# Patient Record
Sex: Female | Born: 1945 | Hispanic: Yes | Marital: Married | Smoking: Former smoker
Health system: Southern US, Community
[De-identification: ages and names within clinical notes are randomized; demographics above are authoritative.]

## PROBLEM LIST (undated history)

## (undated) DIAGNOSIS — C801 Malignant (primary) neoplasm, unspecified: Secondary | ICD-10-CM

## (undated) DIAGNOSIS — F32A Depression, unspecified: Secondary | ICD-10-CM

## (undated) DIAGNOSIS — F419 Anxiety disorder, unspecified: Secondary | ICD-10-CM

## (undated) DIAGNOSIS — F329 Major depressive disorder, single episode, unspecified: Secondary | ICD-10-CM

## (undated) HISTORY — PX: CHOLECYSTECTOMY: SHX55

## (undated) HISTORY — PX: ABDOMINAL HYSTERECTOMY: SHX81

## (undated) HISTORY — PX: JOINT REPLACEMENT: SHX530

## (undated) HISTORY — PX: LUNG CANCER SURGERY: SHX702

---

## 2016-06-01 ENCOUNTER — Other Ambulatory Visit: Payer: Self-pay | Admitting: Orthopedic Surgery

## 2016-06-01 DIAGNOSIS — M67912 Unspecified disorder of synovium and tendon, left shoulder: Secondary | ICD-10-CM

## 2016-06-08 ENCOUNTER — Other Ambulatory Visit: Payer: Self-pay

## 2016-06-13 ENCOUNTER — Other Ambulatory Visit: Payer: Self-pay

## 2016-06-18 ENCOUNTER — Ambulatory Visit
Admission: RE | Admit: 2016-06-18 | Discharge: 2016-06-18 | Disposition: A | Discharge status: Home / Self Care | Payer: Medicare Other | Source: Ambulatory Visit | Attending: Orthopedic Surgery | Admitting: Orthopedic Surgery

## 2016-06-18 DIAGNOSIS — M67912 Unspecified disorder of synovium and tendon, left shoulder: Secondary | ICD-10-CM

## 2016-06-20 ENCOUNTER — Other Ambulatory Visit: Payer: Self-pay

## 2016-06-25 ENCOUNTER — Other Ambulatory Visit: Payer: Self-pay | Admitting: Orthopedic Surgery

## 2016-06-27 ENCOUNTER — Encounter (HOSPITAL_BASED_OUTPATIENT_CLINIC_OR_DEPARTMENT_OTHER): Payer: Self-pay | Admitting: *Deleted

## 2016-06-28 ENCOUNTER — Encounter (HOSPITAL_BASED_OUTPATIENT_CLINIC_OR_DEPARTMENT_OTHER)
Admission: RE | Admit: 2016-06-28 | Discharge: 2016-06-28 | Disposition: A | Discharge status: Home / Self Care | Payer: Medicare Other | Source: Ambulatory Visit | Attending: Orthopedic Surgery | Admitting: Orthopedic Surgery

## 2016-06-28 ENCOUNTER — Other Ambulatory Visit: Payer: Self-pay

## 2016-06-28 DIAGNOSIS — I10 Essential (primary) hypertension: Secondary | ICD-10-CM | POA: Diagnosis not present

## 2016-06-28 DIAGNOSIS — E119 Type 2 diabetes mellitus without complications: Secondary | ICD-10-CM | POA: Diagnosis present

## 2016-06-28 LAB — BASIC METABOLIC PANEL
ANION GAP: 12 (ref 5–15)
BUN: 16 mg/dL (ref 6–20)
CALCIUM: 9.2 mg/dL (ref 8.9–10.3)
CO2: 25 mmol/L (ref 22–32)
CREATININE: 0.66 mg/dL (ref 0.44–1.00)
Chloride: 105 mmol/L (ref 101–111)
GFR calc Af Amer: >60 mL/min (ref >60)
GLUCOSE: 89 mg/dL (ref 65–99)
Potassium: 4.8 mmol/L (ref 3.5–5.1)
Sodium: 142 mmol/L (ref 135–145)

## 2016-07-02 ENCOUNTER — Encounter (HOSPITAL_BASED_OUTPATIENT_CLINIC_OR_DEPARTMENT_OTHER)
Admission: RE | Disposition: A | Discharge status: Home / Self Care | Payer: Self-pay | Source: Ambulatory Visit | Attending: Orthopedic Surgery

## 2016-07-02 ENCOUNTER — Ambulatory Visit (HOSPITAL_BASED_OUTPATIENT_CLINIC_OR_DEPARTMENT_OTHER): Payer: Medicare Other | Admitting: Certified Registered"

## 2016-07-02 ENCOUNTER — Ambulatory Visit (HOSPITAL_BASED_OUTPATIENT_CLINIC_OR_DEPARTMENT_OTHER)
Admission: RE | Admit: 2016-07-02 | Discharge: 2016-07-02 | Disposition: A | Discharge status: Home / Self Care | Payer: Medicare Other | Source: Ambulatory Visit | Attending: Orthopedic Surgery | Admitting: Orthopedic Surgery

## 2016-07-02 ENCOUNTER — Encounter (HOSPITAL_BASED_OUTPATIENT_CLINIC_OR_DEPARTMENT_OTHER): Payer: Self-pay | Admitting: Anesthesiology

## 2016-07-02 DIAGNOSIS — Z85118 Personal history of other malignant neoplasm of bronchus and lung: Secondary | ICD-10-CM | POA: Insufficient documentation

## 2016-07-02 DIAGNOSIS — F329 Major depressive disorder, single episode, unspecified: Secondary | ICD-10-CM | POA: Insufficient documentation

## 2016-07-02 DIAGNOSIS — Z87891 Personal history of nicotine dependence: Secondary | ICD-10-CM | POA: Diagnosis not present

## 2016-07-02 DIAGNOSIS — Z7984 Long term (current) use of oral hypoglycemic drugs: Secondary | ICD-10-CM | POA: Diagnosis not present

## 2016-07-02 DIAGNOSIS — Z7982 Long term (current) use of aspirin: Secondary | ICD-10-CM | POA: Diagnosis not present

## 2016-07-02 DIAGNOSIS — M94212 Chondromalacia, left shoulder: Secondary | ICD-10-CM | POA: Diagnosis not present

## 2016-07-02 DIAGNOSIS — M75112 Incomplete rotator cuff tear or rupture of left shoulder, not specified as traumatic: Secondary | ICD-10-CM | POA: Diagnosis not present

## 2016-07-02 DIAGNOSIS — M12812 Other specific arthropathies, not elsewhere classified, left shoulder: Secondary | ICD-10-CM | POA: Insufficient documentation

## 2016-07-02 DIAGNOSIS — F419 Anxiety disorder, unspecified: Secondary | ICD-10-CM | POA: Diagnosis not present

## 2016-07-02 DIAGNOSIS — M25512 Pain in left shoulder: Secondary | ICD-10-CM | POA: Diagnosis present

## 2016-07-02 HISTORY — PX: SHOULDER ARTHROSCOPY WITH SUBACROMIAL DECOMPRESSION: SHX5684

## 2016-07-02 HISTORY — DX: Major depressive disorder, single episode, unspecified: F32.9

## 2016-07-02 HISTORY — DX: Anxiety disorder, unspecified: F41.9

## 2016-07-02 HISTORY — DX: Depression, unspecified: F32.A

## 2016-07-02 HISTORY — DX: Malignant (primary) neoplasm, unspecified: C80.1

## 2016-07-02 LAB — GLUCOSE, CAPILLARY
Glucose-Capillary: 122 mg/dL — ABNORMAL HIGH (ref 65–99)
Glucose-Capillary: 111 mg/dL — ABNORMAL HIGH (ref 65–99)

## 2016-07-02 SURGERY — SHOULDER ARTHROSCOPY WITH SUBACROMIAL DECOMPRESSION
Anesthesia: General | Laterality: Left

## 2016-07-02 MED ORDER — MEPERIDINE HCL 25 MG/ML IJ SOLN: 6.2500 mg | INTRAMUSCULAR | Status: DC | PRN

## 2016-07-02 MED ORDER — FENTANYL CITRATE (PF) 100 MCG/2ML IJ SOLN
50.0000 ug | INTRAMUSCULAR | Status: AC | PRN
  Administered 2016-07-02: 50 ug via INTRAVENOUS
  Administered 2016-07-02 (×2): 25 ug via INTRAVENOUS

## 2016-07-02 MED ORDER — MIDAZOLAM HCL 2 MG/2ML IJ SOLN
INTRAMUSCULAR | Status: AC
  Filled 2016-07-02: qty 2

## 2016-07-02 MED ORDER — PHENYLEPHRINE HCL 10 MG/ML IJ SOLN
INTRAMUSCULAR | Status: DC | PRN
  Administered 2016-07-02 (×6): 80 ug via INTRAVENOUS

## 2016-07-02 MED ORDER — LACTATED RINGERS IV SOLN: INTRAVENOUS | Status: DC

## 2016-07-02 MED ORDER — BUPIVACAINE-EPINEPHRINE (PF) 0.5% -1:200000 IJ SOLN
INTRAMUSCULAR | Status: DC | PRN
  Administered 2016-07-02: 15 mL via PERINEURAL

## 2016-07-02 MED ORDER — CEFAZOLIN SODIUM-DEXTROSE 2-4 GM/100ML-% IV SOLN
2.0000 g | INTRAVENOUS | Status: AC
  Administered 2016-07-02: 2 g via INTRAVENOUS

## 2016-07-02 MED ORDER — OXYCODONE-ACETAMINOPHEN 5-325 MG PO TABS: 1.0000 | ORAL_TABLET | ORAL | 0 refills | Status: AC | PRN

## 2016-07-02 MED ORDER — DEXAMETHASONE SODIUM PHOSPHATE 4 MG/ML IJ SOLN
INTRAMUSCULAR | Status: DC | PRN
  Administered 2016-07-02: 4 mg via INTRAVENOUS

## 2016-07-02 MED ORDER — ONDANSETRON HCL 4 MG/2ML IJ SOLN
INTRAMUSCULAR | Status: DC | PRN
Start: 2016-07-02 — End: 2016-07-02
  Administered 2016-07-02: 4 mg via INTRAVENOUS

## 2016-07-02 MED ORDER — CEFAZOLIN SODIUM-DEXTROSE 2-4 GM/100ML-% IV SOLN
INTRAVENOUS | Status: AC
  Filled 2016-07-02: qty 100

## 2016-07-02 MED ORDER — SUCCINYLCHOLINE CHLORIDE 20 MG/ML IJ SOLN
INTRAMUSCULAR | Status: DC | PRN
  Administered 2016-07-02: 120 mg via INTRAVENOUS

## 2016-07-02 MED ORDER — LACTATED RINGERS IV SOLN
INTRAVENOUS | Status: DC
  Administered 2016-07-02 (×2): via INTRAVENOUS

## 2016-07-02 MED ORDER — LIDOCAINE HCL (CARDIAC) 20 MG/ML IV SOLN
INTRAVENOUS | Status: DC | PRN
  Administered 2016-07-02: 60 mg via INTRAVENOUS

## 2016-07-02 MED ORDER — DOCUSATE SODIUM 100 MG PO CAPS: 100.0000 mg | ORAL_CAPSULE | Freq: Three times a day (TID) | ORAL | 0 refills | Status: AC | PRN

## 2016-07-02 MED ORDER — GLYCOPYRROLATE 0.2 MG/ML IJ SOLN
INTRAMUSCULAR | Status: DC | PRN
  Administered 2016-07-02: 0.2 mg via INTRAVENOUS

## 2016-07-02 MED ORDER — SCOPOLAMINE 1 MG/3DAYS TD PT72: 1.0000 | MEDICATED_PATCH | Freq: Once | TRANSDERMAL | Status: DC | PRN

## 2016-07-02 MED ORDER — FENTANYL CITRATE (PF) 100 MCG/2ML IJ SOLN: 25.0000 ug | INTRAMUSCULAR | Status: DC | PRN

## 2016-07-02 MED ORDER — MIDAZOLAM HCL 2 MG/2ML IJ SOLN
1.0000 mg | INTRAMUSCULAR | Status: DC | PRN
  Administered 2016-07-02: 2 mg via INTRAVENOUS

## 2016-07-02 MED ORDER — POVIDONE-IODINE 7.5 % EX SOLN: Freq: Once | CUTANEOUS | Status: DC

## 2016-07-02 MED ORDER — PROMETHAZINE HCL 25 MG/ML IJ SOLN: 6.2500 mg | INTRAMUSCULAR | Status: DC | PRN

## 2016-07-02 MED ORDER — PROPOFOL 10 MG/ML IV BOLUS
INTRAVENOUS | Status: DC | PRN
  Administered 2016-07-02: 20 mg via INTRAVENOUS
  Administered 2016-07-02: 150 mg via INTRAVENOUS
  Administered 2016-07-02: 50 mg via INTRAVENOUS

## 2016-07-02 MED ORDER — FENTANYL CITRATE (PF) 100 MCG/2ML IJ SOLN
INTRAMUSCULAR | Status: AC
  Filled 2016-07-02: qty 2

## 2016-07-02 SURGICAL SUPPLY — 82 items
BENZOIN TINCTURE PRP APPL 2/3 (GAUZE/BANDAGES/DRESSINGS) IMPLANT
BLADE CLIPPER SURG (BLADE) IMPLANT
BLADE SURG 15 STRL LF DISP TIS (BLADE) IMPLANT
BLADE SURG 15 STRL SS (BLADE)
BUR OVAL 4.0 (BURR) ×3 IMPLANT
CANNULA 5.75X71 LONG (CANNULA) ×3 IMPLANT
CANNULA TWIST IN 8.25X7CM (CANNULA) IMPLANT
CHLORAPREP W/TINT 26ML (MISCELLANEOUS) ×3 IMPLANT
CLOSURE WOUND 1/2 X4 (GAUZE/BANDAGES/DRESSINGS)
DECANTER SPIKE VIAL GLASS SM (MISCELLANEOUS) IMPLANT
DERMABOND ADVANCED (GAUZE/BANDAGES/DRESSINGS)
DERMABOND ADVANCED .7 DNX12 (GAUZE/BANDAGES/DRESSINGS) IMPLANT
DRAPE IMP U-DRAPE 54X76 (DRAPES) ×3 IMPLANT
DRAPE INCISE IOBAN 66X45 STRL (DRAPES) ×3 IMPLANT
DRAPE STERI 35X30 U-POUCH (DRAPES) ×3 IMPLANT
DRAPE SURG 17X23 STRL (DRAPES) ×3 IMPLANT
DRAPE U-SHAPE 47X51 STRL (DRAPES) ×3 IMPLANT
DRAPE U-SHAPE 76X120 STRL (DRAPES) ×6 IMPLANT
DRSG PAD ABDOMINAL 8X10 ST (GAUZE/BANDAGES/DRESSINGS) ×3 IMPLANT
ELECT REM PT RETURN 9FT ADLT (ELECTROSURGICAL)
ELECTRODE REM PT RTRN 9FT ADLT (ELECTROSURGICAL) IMPLANT
GAUZE SPONGE 4X4 12PLY STRL (GAUZE/BANDAGES/DRESSINGS) ×3 IMPLANT
GAUZE SPONGE 4X4 16PLY XRAY LF (GAUZE/BANDAGES/DRESSINGS) IMPLANT
GAUZE XEROFORM 1X8 LF (GAUZE/BANDAGES/DRESSINGS) ×3 IMPLANT
GLOVE BIO SURGEON STRL SZ7 (GLOVE) ×3 IMPLANT
GLOVE BIO SURGEON STRL SZ7.5 (GLOVE) ×3 IMPLANT
GLOVE BIOGEL PI IND STRL 7.0 (GLOVE) ×1 IMPLANT
GLOVE BIOGEL PI IND STRL 8 (GLOVE) ×1 IMPLANT
GLOVE BIOGEL PI INDICATOR 7.0 (GLOVE) ×2
GLOVE BIOGEL PI INDICATOR 8 (GLOVE) ×2
GOWN STRL REUS W/ TWL LRG LVL3 (GOWN DISPOSABLE) ×2 IMPLANT
GOWN STRL REUS W/ TWL XL LVL3 (GOWN DISPOSABLE) ×1 IMPLANT
GOWN STRL REUS W/TWL LRG LVL3 (GOWN DISPOSABLE) ×4
GOWN STRL REUS W/TWL XL LVL3 (GOWN DISPOSABLE) ×2
LASSO CRESCENT QUICKPASS (SUTURE) IMPLANT
MANIFOLD NEPTUNE II (INSTRUMENTS) ×3 IMPLANT
NDL SUT 6 .5 CRC .975X.05 MAYO (NEEDLE) IMPLANT
NEEDLE 1/2 CIR CATGUT .05X1.09 (NEEDLE) IMPLANT
NEEDLE MAYO TAPER (NEEDLE)
NEEDLE SCORPION MULTI FIRE (NEEDLE) IMPLANT
NS IRRIG 1000ML POUR BTL (IV SOLUTION) IMPLANT
PACK ARTHROSCOPY DSU (CUSTOM PROCEDURE TRAY) ×3 IMPLANT
PACK BASIN DAY SURGERY FS (CUSTOM PROCEDURE TRAY) ×3 IMPLANT
PENCIL BUTTON HOLSTER BLD 10FT (ELECTRODE) ×3 IMPLANT
PROBE BIPOLAR ATHRO 135MM 90D (MISCELLANEOUS) ×3 IMPLANT
RESECTOR FULL RADIUS 4.2MM (BLADE) ×3 IMPLANT
RESTRAINT HEAD UNIVERSAL NS (MISCELLANEOUS) ×3 IMPLANT
SHEET MEDIUM DRAPE 40X70 STRL (DRAPES) IMPLANT
SLEEVE SCD COMPRESS KNEE MED (MISCELLANEOUS) ×3 IMPLANT
SLING ARM FOAM STRAP LRG (SOFTGOODS) IMPLANT
SLING ARM IMMOBILIZER LRG (SOFTGOODS) ×3 IMPLANT
SLING ARM IMMOBILIZER MED (SOFTGOODS) IMPLANT
SLING ARM MED ADULT FOAM STRAP (SOFTGOODS) ×3 IMPLANT
SLING ARM XL FOAM STRAP (SOFTGOODS) IMPLANT
SPONGE LAP 4X18 X RAY DECT (DISPOSABLE) ×3 IMPLANT
STRIP CLOSURE SKIN 1/2X4 (GAUZE/BANDAGES/DRESSINGS) IMPLANT
SUCTION FRAZIER HANDLE 10FR (MISCELLANEOUS)
SUCTION TUBE FRAZIER 10FR DISP (MISCELLANEOUS) IMPLANT
SUPPORT WRAP ARM LG (MISCELLANEOUS) IMPLANT
SUT BONE WAX W31G (SUTURE) IMPLANT
SUT ETHILON 3 0 PS 1 (SUTURE) ×3 IMPLANT
SUT ETHILON 4 0 PS 2 18 (SUTURE) IMPLANT
SUT FIBERWIRE #2 38 T-5 BLUE (SUTURE)
SUT MNCRL AB 4-0 PS2 18 (SUTURE) IMPLANT
SUT PDS AB 0 CT 36 (SUTURE) IMPLANT
SUT PROLENE 3 0 PS 2 (SUTURE) IMPLANT
SUT TIGER TAPE 7 IN WHITE (SUTURE) IMPLANT
SUT VIC AB 0 CT1 27 (SUTURE) ×2
SUT VIC AB 0 CT1 27XBRD ANBCTR (SUTURE) ×1 IMPLANT
SUT VIC AB 2-0 SH 27 (SUTURE) ×2
SUT VIC AB 2-0 SH 27XBRD (SUTURE) ×1 IMPLANT
SUTURE FIBERWR #2 38 T-5 BLUE (SUTURE) IMPLANT
SYR BULB 3OZ (MISCELLANEOUS) IMPLANT
TAPE CLOTH SURG 6X10 WHT LF (GAUZE/BANDAGES/DRESSINGS) ×3 IMPLANT
TAPE FIBER 2MM 7IN #2 BLUE (SUTURE) IMPLANT
TOWEL OR 17X24 6PK STRL BLUE (TOWEL DISPOSABLE) ×3 IMPLANT
TOWEL OR NON WOVEN STRL DISP B (DISPOSABLE) ×3 IMPLANT
TUBE CONNECTING 20'X1/4 (TUBING) ×1
TUBE CONNECTING 20X1/4 (TUBING) ×2 IMPLANT
TUBING ARTHROSCOPY IRRIG 16FT (MISCELLANEOUS) ×3 IMPLANT
WATER STERILE IRR 1000ML POUR (IV SOLUTION) ×3 IMPLANT
YANKAUER SUCT BULB TIP NO VENT (SUCTIONS) IMPLANT

## 2016-07-02 NOTE — Anesthesia Preprocedure Evaluation (Addendum)
Anesthesia Evaluation  Patient identified by MRN, date of birth, ID band Patient awake    Reviewed: Allergy & Precautions, NPO status , Patient's Chart, lab work & pertinent test results  Airway Mallampati: II  TM Distance: >3 FB Neck ROM: Full    Dental  (+) Teeth Intact, Dental Advisory Given   Pulmonary former smoker,    breath sounds clear to auscultation       Cardiovascular hypertension, Pt. on medications  Rhythm:Regular Rate:Normal     Neuro/Psych PSYCHIATRIC DISORDERS Anxiety Depression negative neurological ROS     GI/Hepatic negative GI ROS, Neg liver ROS,   Endo/Other  diabetes, Type 2, Oral Hypoglycemic Agents  Renal/GU negative Renal ROS  negative genitourinary   Musculoskeletal negative musculoskeletal ROS (+)   Abdominal   Peds negative pediatric ROS (+)  Hematology negative hematology ROS (+)   Anesthesia Other Findings   Reproductive/Obstetrics negative OB ROS                            No results found for: WBC, HGB, HCT, MCV, PLT   06/2016 EKG: normal sinus rhythm.  Anesthesia Physical Anesthesia Plan  ASA: II  Anesthesia Plan: General   Post-op Pain Management: GA combined w/ Regional for post-op pain   Induction: Intravenous  Airway Management Planned: Oral ETT  Additional Equipment:   Intra-op Plan:   Post-operative Plan: Extubation in OR  Informed Consent: I have reviewed the patients History and Physical, chart, labs and discussed the procedure including the risks, benefits and alternatives for the proposed anesthesia with the patient or authorized representative who has indicated his/her understanding and acceptance.   Dental advisory given  Plan Discussed with: CRNA  Anesthesia Plan Comments:         Anesthesia Quick Evaluation

## 2016-07-02 NOTE — Transfer of Care (Signed)
Immediate Anesthesia Transfer of Care Note  Patient: Alyssa Dillon  Procedure(s) Performed: Procedure(s) with comments: LEFT SHOULDER ARTHROSCOPY DEBRIDEMENT ROTATOR CUFF, SUBACROMIAL DECOMPRESSION, DISTAL CLAVICAL EXCISION REMOVAL OF LOSE BODY (Left) - LEFT SHOULDER ARTHROSCOPY DEBRIDEMENT ROTATOR CUFF, SUBACROMIAL DECOMPRESSION, DISTAL CLAVICAL EXCISION  Patient Location: PACU  Anesthesia Type:GA combined with regional for post-op pain  Level of Consciousness: awake, alert  and oriented  Airway & Oxygen Therapy: Patient Spontanous Breathing and Patient connected to face mask oxygen  Post-op Assessment: Report given to RN and Post -op Vital signs reviewed and stable  Post vital signs: Reviewed and stable  Last Vitals:  Vitals:   07/02/16 1215 07/02/16 1415  BP: 108/61 (!) 149/86  Pulse: 69 90  Resp: 19   Temp:      Last Pain:  Vitals:   07/02/16 1134  TempSrc: Oral         Complications: No apparent anesthesia complications

## 2016-07-02 NOTE — H&P (Signed)
Alyssa Dillon is an 71 y.o. female.   Chief Complaint: L shoulder pain and dsyfunction HPI: L shoulder pain with partial RCT, AC arthropathy, failed nonoperative treatment.  Past Medical History:  Diagnosis Date  . Anxiety   . Cancer ([place])   . Depression     Past Surgical History:  Procedure Laterality Date  . ABDOMINAL HYSTERECTOMY     1994  . CHOLECYSTECTOMY     1985  . JOINT REPLACEMENT     October 2017  . LUNG CANCER SURGERY Right    2014    History reviewed. No pertinent family history. Social History:  reports that she has quit smoking. She has never used smokeless tobacco. She reports that she drinks alcohol. She reports that she does not use drugs.  Allergies: No Known Allergies  Medications Prior to Admission  Medication Sig Dispense Refill  . amlodipine-atorvastatin (CADUET) 2.5-10 MG tablet Take 1 tablet by mouth daily.    Marland Kitchen aspirin EC 81 MG tablet Take 81 mg by mouth daily.    Marland Kitchen glipiZIDE (GLUCOTROL) 5 MG tablet Take 5 mg by mouth daily after supper.    . ibandronate (BONIVA) 150 MG tablet Take 150 mg by mouth every 30 (thirty) days. Take in the morning with a full glass of water, on an empty stomach, and do not take anything else by mouth or lie down for the next 30 min.    . irbesartan (AVAPRO) 150 MG tablet Take 150 mg by mouth every morning.    . metFORMIN (GLUCOPHAGE) 500 MG tablet Take by mouth daily with breakfast.    . rosuvastatin (CRESTOR) 5 MG tablet Take 5 mg by mouth daily at 6 PM.    . sertraline (ZOLOFT) 50 MG tablet Take 50 mg by mouth daily.    . vitamin B-12 (CYANOCOBALAMIN) 100 MCG tablet Take 100 mcg by mouth daily.      Results for orders placed or performed during the hospital encounter of 07/02/16 (from the past 48 hour(s))  Glucose, capillary     Status: Abnormal   Collection Time: 07/02/16 11:58 AM  Result Value Ref Range   Glucose-Capillary 122 (H) 65 - 99 mg/dL   No results found.  Review of Systems  All other systems reviewed and  are negative.   Blood pressure 108/61, pulse 69, temperature 97.6 F (36.4 C), temperature source Oral, resp. rate 19, height 5\' 4"  (1.626 m), weight 99.8 kg (220 lb), SpO2 99 %. Physical Exam  Constitutional: She is oriented to person, place, and time. She appears well-developed and well-nourished.  HENT:  Head: Atraumatic.  Eyes: EOM are normal.  Cardiovascular: Intact distal pulses.   Respiratory: Effort normal.  Musculoskeletal:  L shoulder TTP over RC and AC joint.  Neurological: She is alert and oriented to person, place, and time.  Skin: Skin is warm and dry.  Psychiatric: She has a normal mood and affect.     Assessment/Plan  L shoulder pain with partial RCT, AC arthropathy, failed nonoperative treatment. Plan L shoulder arth SAD, DCR Risks / benefits of surgery discussed Consent on chart  NPO for OR Preop antibiotics   Nita Sells, MD 07/02/2016, 12:24 PM

## 2016-07-02 NOTE — Anesthesia Procedure Notes (Signed)
Anesthesia Regional Block:  Interscalene brachial plexus block  Pre-Anesthetic Checklist: ,, timeout performed, Correct Patient, Correct Site, Correct Laterality, Correct Procedure, Correct Position, site marked, Risks and benefits discussed,  Surgical consent,  Pre-op evaluation,  At surgeon's request and post-op pain management  Laterality: Left  Prep: chloraprep       Needles:  Injection technique: Single-shot  Needle Type: Echogenic Needle     Needle Length: 9cm 9 cm Needle Gauge: 21 and 21 G    Additional Needles:  Procedures: ultrasound guided (picture in chart) Interscalene brachial plexus block Narrative:  Start time: 07/02/2016 12:10 PM End time: 07/02/2016 12:15 PM Injection made incrementally with aspirations every 5 mL.  Performed by: Personally  Anesthesiologist: Suella Broad D  Additional Notes: Pt tolerated well.

## 2016-07-02 NOTE — Anesthesia Postprocedure Evaluation (Addendum)
Anesthesia Post Note  Patient: Mariam Dollar  Procedure(s) Performed: Procedure(s) (LRB): LEFT SHOULDER ARTHROSCOPY DEBRIDEMENT ROTATOR CUFF, SUBACROMIAL DECOMPRESSION, DISTAL CLAVICAL EXCISION REMOVAL OF LOSE BODY (Left)  Patient location during evaluation: PACU Anesthesia Type: General Level of consciousness: awake and alert Pain management: pain level controlled Vital Signs Assessment: post-procedure vital signs reviewed and stable Respiratory status: spontaneous breathing, nonlabored ventilation, respiratory function stable and patient connected to nasal cannula oxygen Cardiovascular status: blood pressure returned to baseline and stable Postop Assessment: no signs of nausea or vomiting Anesthetic complications: no       Last Vitals:  Vitals:   07/02/16 1445 07/02/16 1513  BP: 133/89 (!) 158/79  Pulse: 79 78  Resp: 18 18  Temp:  36.5 C    Last Pain:  Vitals:   07/02/16 1513  TempSrc:   PainSc: 0-No pain                 Effie Berkshire

## 2016-07-02 NOTE — Discharge Instructions (Signed)
°  Post Anesthesia Home Care Instructions  Activity: Get plenty of rest for the remainder of the day. A responsible adult should stay with you for 24 hours following the procedure.  For the next 24 hours, DO NOT: -Drive a car -Paediatric nurse -Drink alcoholic beverages -Take any medication unless instructed by your physician -Make any legal decisions or sign important papers.  Meals: Start with liquid foods such as gelatin or soup. Progress to regular foods as tolerated. Avoid greasy, spicy, heavy foods. If nausea and/or vomiting occur, drink only clear liquids until the nausea and/or vomiting subsides. Call your physician if vomiting continues.  Special Instructions/Symptoms: Your throat may feel dry or sore from the anesthesia or the breathing tube placed in your throat during surgery. If this causes discomfort, gargle with warm salt water. The discomfort should disappear within 24 hours.  If you had a scopolamine patch placed behind your ear for the management of post- operative nausea and/or vomiting:  1. The medication in the patch is effective for 72 hours, after which it should be removed.  Wrap patch in a tissue and discard in the trash. Wash hands thoroughly with soap and water. 2. You may remove the patch earlier than 72 hours if you experience unpleasant side effects which may include dry mouth, dizziness or visual disturbances. 3. Avoid touching the patch. Wash your hands with soap and water after contact with the patch.   Regional Anesthesia Blocks  1. Numbness or the inability to move the "blocked" extremity may last from 3-48 hours after placement. The length of time depends on the medication injected and your individual response to the medication. If the numbness is not going away after 48 hours, call your surgeon.  2. The extremity that is blocked will need to be protected until the numbness is gone and the  Strength has returned. Because you cannot feel it, you will need  to take extra care to avoid injury. Because it may be weak, you may have difficulty moving it or using it. You may not know what position it is in without looking at it while the block is in effect.  3. For blocks in the legs and feet, returning to weight bearing and walking needs to be done carefully. You will need to wait until the numbness is entirely gone and the strength has returned. You should be able to move your leg and foot normally before you try and bear weight or walk. You will need someone to be with you when you first try to ensure you do not fall and possibly risk injury.  4. Bruising and tenderness at the needle site are common side effects and will resolve in a few days.  5. Persistent numbness or new problems with movement should be communicated to the surgeon or the [facility] [phone] [place] [phone]).Call your surgeon if you experience:   1.  Fever over 101.0. 2.  Inability to urinate. 3.  Nausea and/or vomiting. 4.  Extreme swelling or bruising at the surgical site. 5.  Continued bleeding from the incision. 6.  Increased pain, redness or drainage from the incision. 7.  Problems related to your pain medication. 8.  Any problems and/or concerns

## 2016-07-02 NOTE — Progress Notes (Signed)
Assisted Dr. Hollis with left, ultrasound guided, supraclavicular block. Side rails up, monitors on throughout procedure. See vital signs in flow sheet. Tolerated Procedure well. 

## 2016-07-02 NOTE — Op Note (Signed)
Procedure(s):   Alyssa Dillon female 71 y.o. 07/02/2016  Procedure(s) and Anesthesia Type: #1 left shoulder arthroscopic debridement chondromalacia humeral head and glenoid #2 left shoulder arthroscopic removal of loose body #3 left shoulder arthroscopic subacromial decompression #4 left shoulder arthroscopic distal clavicle excision   Surgeon(s) and Role:    * Tania Ade, MD - Primary     Surgeon: Nita Sells   Assistants: Jeanmarie Hubert PA-C (Danielle was present and scrubbed throughout the procedure and was essential in positioning, assisting with the camera and instrumentation,, and closure)  Anesthesia: General endotracheal anesthesia with preoperative interscalene block given by the attending anesthesiologist    Procedure Detail    Estimated Blood Loss: Min         Drains: none  Blood Given: none         Specimens: none        Complications:  * No complications entered in OR log *         Disposition: PACU - hemodynamically stable.         Condition: stable    Procedure:   INDICATIONS FOR SURGERY: The patient is 71 y.o. female who has a long history of left shoulder pain which had been refractory and nonoperative management. Indicated for surgical treatment to try and decrease pain and restore function.  OPERATIVE FINDINGS: Examination under anesthesia: No stiffness or instability   DESCRIPTION OF PROCEDURE: The patient was identified in preoperative  holding area where I personally marked the operative site after  verifying site, side, and procedure with the patient. An interscalene block was given by the attending anesthesiologist the holding area.  The patient was taken back to the operating room where general anesthesia was induced without complication and was placed in the beach-chair position with the back  elevated about 60 degrees and all extremities and head and neck carefully padded and  positioned.   The left upper  extremity was then prepped and  draped in a standard sterile fashion. The appropriate time-out  procedure was carried out. The patient did receive IV antibiotics  within 30 minutes of incision.   A small posterior portal incision was made and the arthroscope was introduced into the joint. An anterior portal was then established above the subscapularis using needle localization. Small cannula was placed anteriorly. Diagnostic arthroscopy was then carried out.  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 There frayed indicating impingement is a small area on the glenoid centrally with grade 4 exposed bone. The joint was extensively debrided. There was a fairly large approximately 10 x 6 mm osteochondral loose body in the posterior aspect of the joint which was loosely attached to the posterior labrum. This was removed with a large grasper through the anterior portal. There was a moderate amount of synovitis in the joint. This was debrided with the shaver and ArthriCare. The subscapularis and supraspinatus as well as infraspinatus were intact without significant partial tearing. The labrum superiorly at the base of the biceps was also degenerated and torn and debrided. The biceps origin was not significantly involved.  The arthroscope was then introduced into the subacromial space a standard lateral portal was established with needle localization. The shaver was used through the lateral portal to perform extensive bursectomy. Coracoacromial ligament was examined and found to be frayed indicating impingement.  The bursal surface of the rotator cuff was  carefully examined and noted to be completely intact.  The coracoacromial ligament was taken down off the anterior acromion with the ArthroCare exposing a moderate hooked anterior acromial spur. A  high-speed bur was then used through the lateral portal to take down the anterior acromial spur from lateral to medial in a standard acromioplasty.  The acromioplasty was also viewed from the lateral portal and the bur was used as necessary to ensure that the acromion was completely flat from posterior to anterior.  The distal clavicle was exposed arthroscopically and the bur was used to take off the undersurface for approximately 8 mm from the lateral portal. The bur was then moved to an anterior portal position to complete the distal clavicle excision resecting about 8 mm of the distal clavicle and a smooth even fashion. This was viewed from anterior and lateral portals and felt to be complete.  The arthroscopic equipment was removed from the joint and the portals were closed with 3-0 nylon in an interrupted fashion. Sterile dressings were then applied including Xeroform 4 x 4's ABDs and tape. The patient was then allowed to awaken from general anesthesia, placed in a sling, transferred to the stretcher and taken to the recovery room in stable condition.   POSTOPERATIVE PLAN: The patient will be discharged home today and will followup in one week for suture removal and wound check.  She can get back into therapy right away.

## 2016-07-02 NOTE — Anesthesia Procedure Notes (Signed)
Procedure Name: Intubation Date/Time: 07/02/2016 1:06 PM Performed by: Maryella Shivers Pre-anesthesia Checklist: Patient identified, Emergency Drugs available, Suction available and Patient being monitored Patient Re-evaluated:Patient Re-evaluated prior to inductionOxygen Delivery Method: Circle system utilized Preoxygenation: Pre-oxygenation with 100% oxygen Intubation Type: IV induction Ventilation: Mask ventilation without difficulty Laryngoscope Size: Glidescope and 4 Tube type: Oral Number of attempts: 1 Airway Equipment and Method: Stylet and Oral airway Placement Confirmation: ETT inserted through vocal cords under direct vision,  positive ETCO2 and breath sounds checked- equal and bilateral Secured at: 21 cm Tube secured with: Tape Dental Injury: Teeth and Oropharynx as per pre-operative assessment

## 2016-07-03 ENCOUNTER — Encounter (HOSPITAL_BASED_OUTPATIENT_CLINIC_OR_DEPARTMENT_OTHER): Payer: Self-pay | Admitting: Orthopedic Surgery

## 2016-10-26 NOTE — Addendum Note (Signed)
Addendum  created 10/26/16 1006 by Effie Berkshire, MD   Sign clinical note

## 2017-09-11 IMAGING — MR MR SHOULDER*L* W/O CM
5 series · 33 of 40 positions shown · non-contrast
Comparison: None.

CLINICAL DATA: Left shoulder pain for 2 months. Limited range of
motion.

EXAM:
MRI OF THE LEFT SHOULDER WITHOUT CONTRAST
TECHNIQUE: Multiplanar, multisequence MR imaging of the shoulder was performed.
No intravenous contrast was administered.

[Series 3: T2 fat-sat · axial · 4.0mm · 0.55mm/px · z∈[-77,+5]mm · 8 of 20 slices shown (1 of 3)]
[im 1/20]
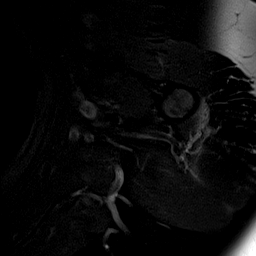
[im 3/20]
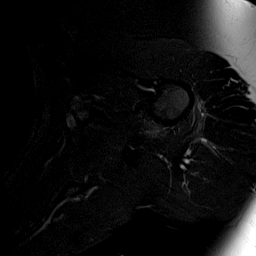
[im 6/20]
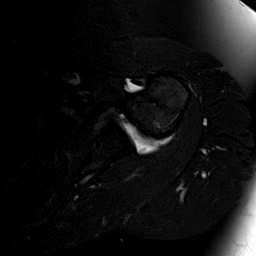
[im 9/20]
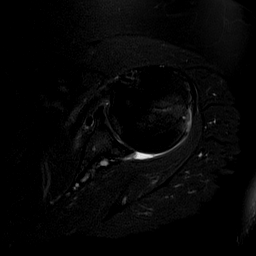
[im 11/20]
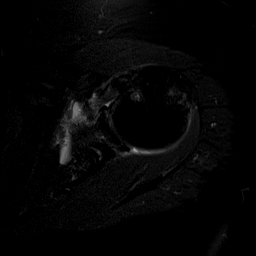
[im 14/20]
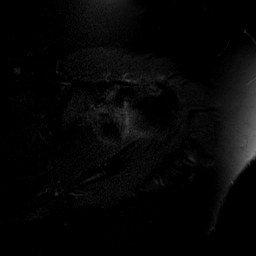
[im 17/20]
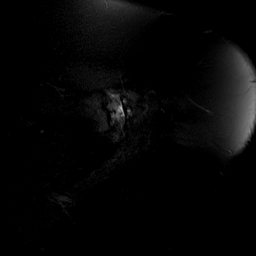
[im 20/20]
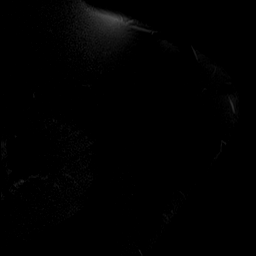

[Series 4: T2 fat-sat · oblique · 4.0mm · 0.27mm/px · 7 of 17 slices shown (2 of 3)]
[im 1/17]
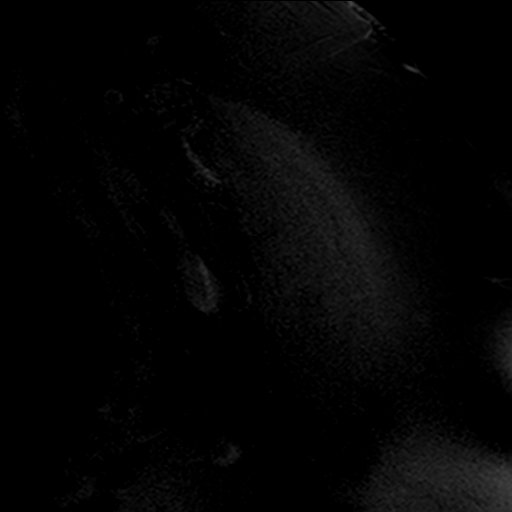
[im 3/17]
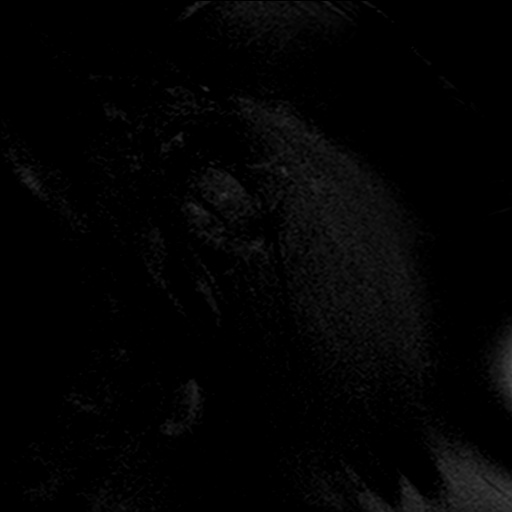
[im 6/17]
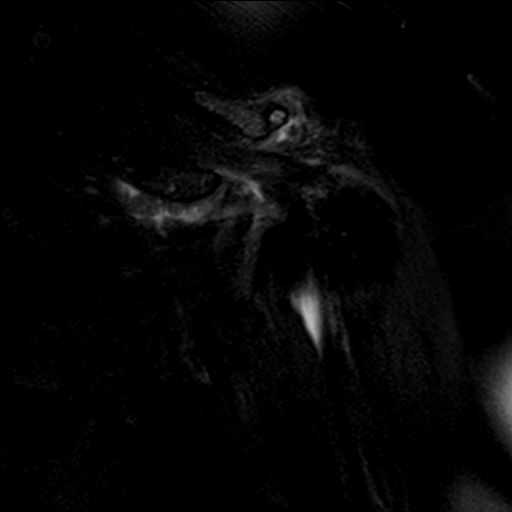
[im 9/17]
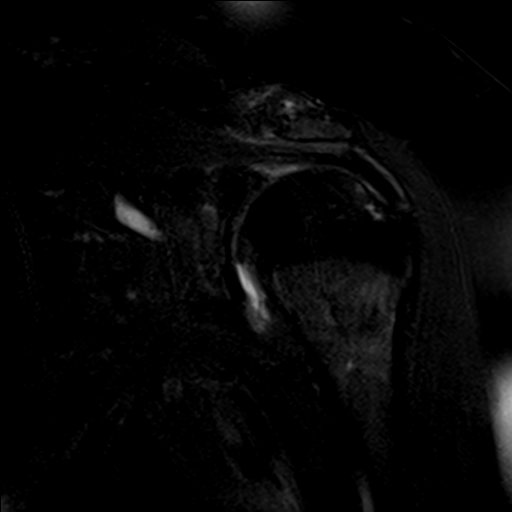
[im 11/17]
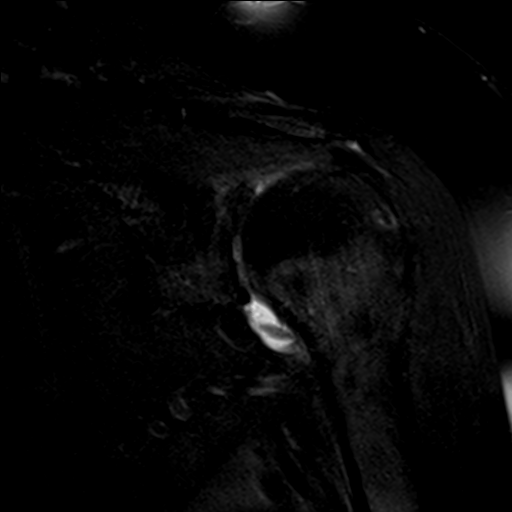
[im 14/17]
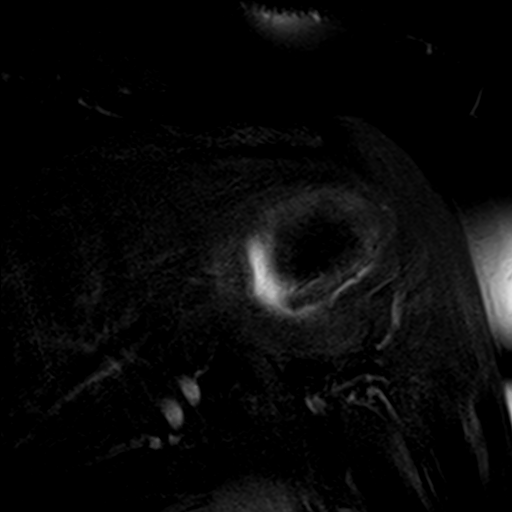
[im 17/17]
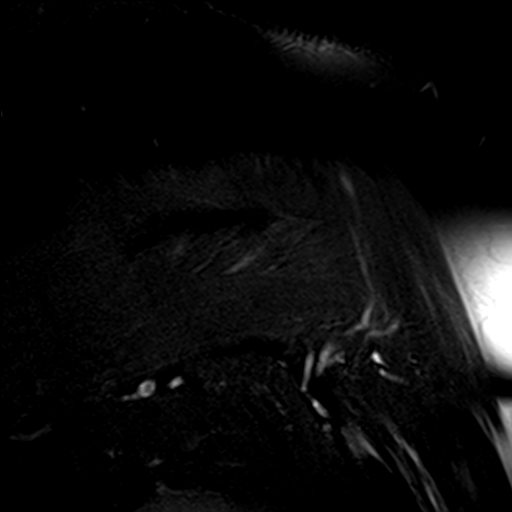

[Series 5: PD · oblique · 4.0mm · 0.44mm/px · 7 of 17 slices shown]
[im 1/17]
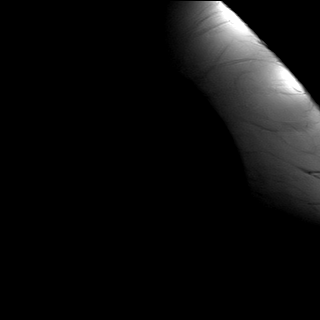
[im 3/17]
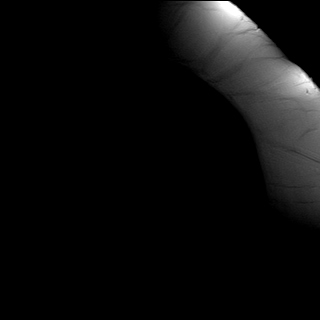
[im 6/17]
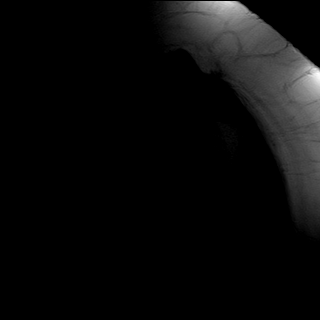
[im 9/17]
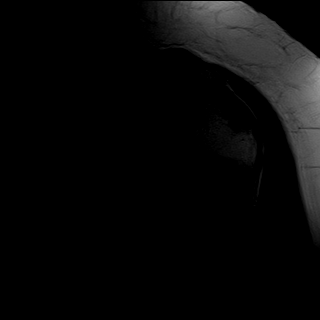
[im 11/17]
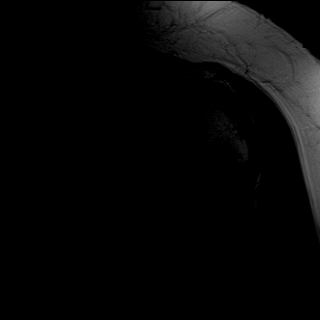
[im 14/17]
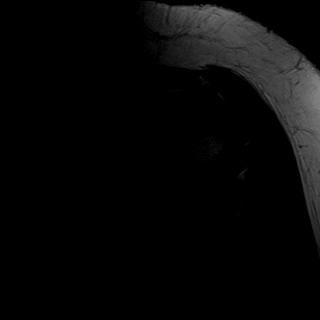
[im 17/17]
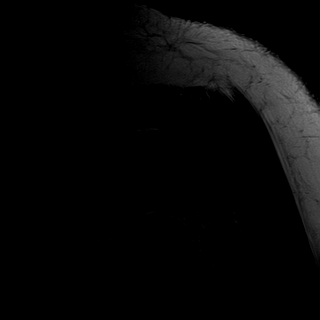

[Series 6: T2 fat-sat · oblique · 4.0mm · 0.55mm/px · 9 of 20 slices shown (3 of 3)]
[im 1/20]
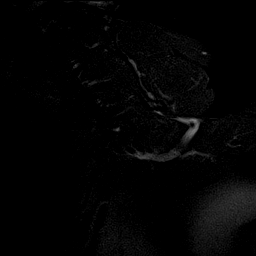
[im 3/20]
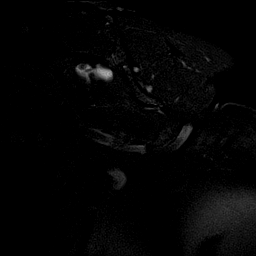
[im 5/20]
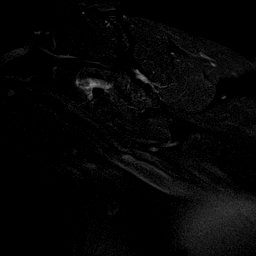
[im 8/20]
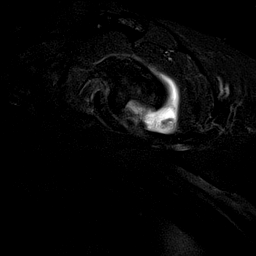
[im 10/20]
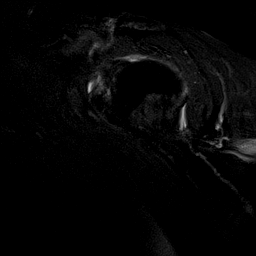
[im 12/20]
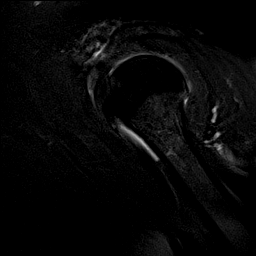
[im 15/20]
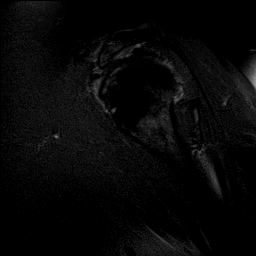
[im 17/20]
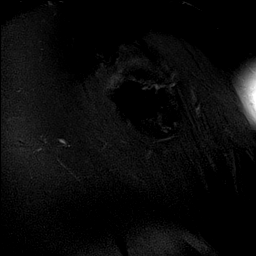
[im 20/20]
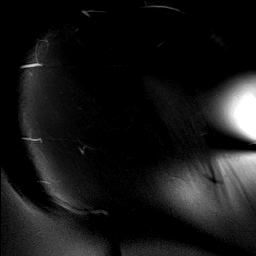

[Series 7: T1 · oblique · 4.0mm · 0.22mm/px · 2 of 20 slices shown]
[im 1/20]
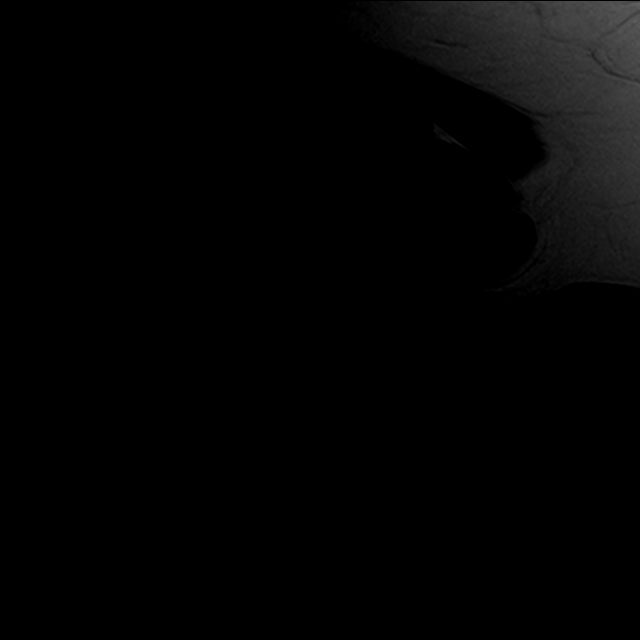
[im 3/20]
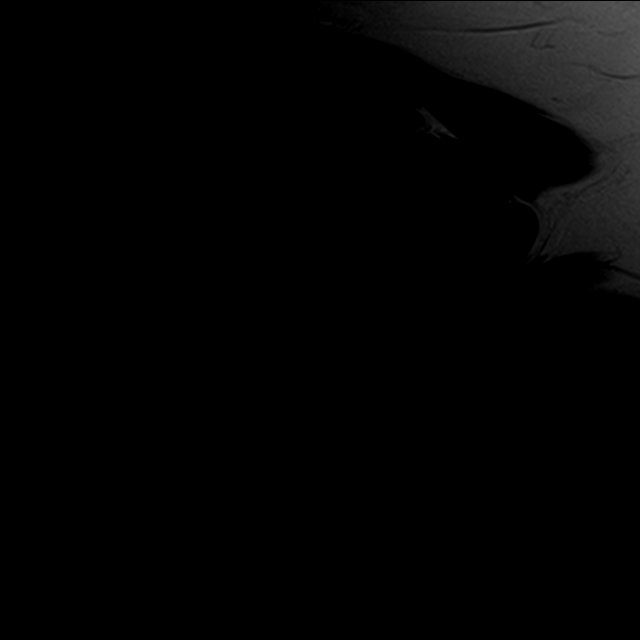

[33 of 40 positions shown; findings below may reference images not displayed]

FINDINGS: Despite efforts by the technologist and patient, motion artifact is
present on today's exam and could not be eliminated. This reduces
exam sensitivity and specificity.

Rotator cuff: Moderate supraspinatus and mild subscapularis
tendinopathy. There is potentially mild partial thickness articular
surface tearing along the distal supraspinatus.

Muscles:  Unremarkable

Biceps long head:  Mild tendinopathy of the intra-articular segment.

Acromioclavicular Joint: Mild spurring and moderate subcortical
marrow edema with fluid in the joint. Trace fluid in the subacromial
subdeltoid bursa. Subacromial morphology is type 2 (curved).

Glenohumeral Joint: Small joint effusion. Mild synovitis along the
axillary pouch and in the rotator interval. Moderate to prominent
degenerative chondral thinning.

Labrum:  Grossly unremarkable, reduced sensitivity due to motion.

Bones: No significant extra-articular osseous abnormalities
identified.

Other: No supplemental non-categorized findings.
IMPRESSION: 1. Moderate supraspinatus and mild subscapularis tendinopathy,
likely with partial thickness articular surface tearing of the
distal most supraspinatus. No full-thickness rotator cuff tear is
identified.
2. Mild biceps tendinopathy.
3. Small shoulder joint effusion, with mild synovitis along the
axillary pouch and in the rotator interval.
4. Moderate to prominent degenerative chondral thinning in the
glenohumeral joint. Moderate degenerative AC joint arthropathy.
5. Trace subacromial subdeltoid bursitis.
